# Patient Record
Sex: Male | Born: 1950 | Race: White | Hispanic: No | Marital: Married
Health system: Southern US, Community
[De-identification: ages and names within clinical notes are randomized; demographics above are authoritative.]

## PROBLEM LIST (undated history)

## (undated) DIAGNOSIS — I10 Essential (primary) hypertension: Secondary | ICD-10-CM

---

## 2020-02-06 ENCOUNTER — Emergency Department (HOSPITAL_COMMUNITY): Payer: BC Managed Care – PPO

## 2020-02-06 ENCOUNTER — Encounter (HOSPITAL_COMMUNITY): Payer: Self-pay

## 2020-02-06 ENCOUNTER — Other Ambulatory Visit: Payer: Self-pay

## 2020-02-06 ENCOUNTER — Emergency Department (HOSPITAL_COMMUNITY)
Admission: EM | Admit: 2020-02-06 | Discharge: 2020-02-06 | Disposition: A | Discharge status: Home / Self Care | Payer: BC Managed Care – PPO | Attending: Emergency Medicine | Admitting: Emergency Medicine

## 2020-02-06 DIAGNOSIS — I10 Essential (primary) hypertension: Secondary | ICD-10-CM | POA: Insufficient documentation

## 2020-02-06 DIAGNOSIS — S0001XA Abrasion of scalp, initial encounter: Secondary | ICD-10-CM | POA: Diagnosis not present

## 2020-02-06 DIAGNOSIS — Y929 Unspecified place or not applicable: Secondary | ICD-10-CM | POA: Insufficient documentation

## 2020-02-06 DIAGNOSIS — S50311A Abrasion of right elbow, initial encounter: Secondary | ICD-10-CM | POA: Insufficient documentation

## 2020-02-06 DIAGNOSIS — Y999 Unspecified external cause status: Secondary | ICD-10-CM | POA: Diagnosis not present

## 2020-02-06 DIAGNOSIS — Z23 Encounter for immunization: Secondary | ICD-10-CM | POA: Insufficient documentation

## 2020-02-06 DIAGNOSIS — Y939 Activity, unspecified: Secondary | ICD-10-CM | POA: Diagnosis not present

## 2020-02-06 DIAGNOSIS — S0003XA Contusion of scalp, initial encounter: Secondary | ICD-10-CM | POA: Diagnosis present

## 2020-02-06 DIAGNOSIS — W1789XA Other fall from one level to another, initial encounter: Secondary | ICD-10-CM | POA: Insufficient documentation

## 2020-02-06 DIAGNOSIS — W19XXXA Unspecified fall, initial encounter: Secondary | ICD-10-CM

## 2020-02-06 HISTORY — DX: Essential (primary) hypertension: I10

## 2020-02-06 MED ORDER — TETANUS-DIPHTH-ACELL PERTUSSIS 5-2.5-18.5 LF-MCG/0.5 IM SUSP
0.5000 mL | Freq: Once | INTRAMUSCULAR | Status: AC
  Administered 2020-02-06: 0.5 mL via INTRAMUSCULAR
  Filled 2020-02-06: qty 0.5

## 2020-02-06 NOTE — Discharge Instructions (Addendum)
The CT scan of your head and neck are reassuring, you do have a large hematoma (bruise) to the back of your scalp with an abrasion.  Your tetanus vaccine was updated today.  Recommend recheck with your primary care provider later this week, return to ER for worsening or concerning symptoms.

## 2020-02-06 NOTE — ED Provider Notes (Signed)
[facility] EMERGENCY DEPARTMENT Provider Note   CSN: [id number] Arrival date & time: 02/06/20  1951     History Chief Complaint  Patient presents with  . Fall    Alejandro Baxter is a 69 y.o. male.  69 year old male with past medical history of hypertension brought in by EMS for evaluation after fall tonight.  Patient states that he was backing out of the bed of a moving type truck when his foot slipped off of the bumper and he fell backwards first landing on his right elbow and then striking the back of his head on the ground.  Patient states he "saw stars" and was able to get up right after the fall on his own and sat back in the truck, at that point he felt the back of his head and noticed that his head was bleeding.  Patient went inside his house, wife was concerned for fall and striking head and called 911.  Patient denies any pain in his neck or back, denies headache, visual disturbance, nausea, vomiting, confusion or repeat questioning.  Patient is not on any blood thinners.  No other injuries, complaints or concerns.        Past Medical History:  Diagnosis Date  . Hypertension     There are no problems to display for this patient.   No family history on file.  Social History   Tobacco Use  . Smoking status: Not on file  Substance Use Topics  . Alcohol use: Not on file  . Drug use: Not on file    Home Medications Prior to Admission medications   Not on File    Allergies    Patient has no known allergies.  Review of Systems   Review of Systems  Constitutional: Negative for fever.  Eyes: Negative for visual disturbance.  Gastrointestinal: Negative for nausea and vomiting.  Musculoskeletal: Negative for back pain, gait problem, neck pain and neck stiffness.  Skin: Positive for wound.  Allergic/Immunologic: Negative for immunocompromised state.  Neurological: Negative for dizziness and headaches.  Hematological: Does not bruise/bleed easily.    Psychiatric/Behavioral: Negative for confusion.  All other systems reviewed and are negative.   Physical Exam Updated Vital Signs BP (!) 154/77 (BP Location: Right Arm)   Pulse 85   Temp 97.8 F (36.6 C) (Oral)   Resp 16   Ht 5\' 10"  (1.778 m)   Wt 81.6 kg   SpO2 99%   BMI 25.83 kg/m   Physical Exam Vitals and nursing note reviewed.  Constitutional:      General: He is not in acute distress.    Appearance: He is well-developed. He is not diaphoretic.  HENT:     Head: Normocephalic.   Eyes:     Extraocular Movements: Extraocular movements intact.     Pupils: Pupils are equal, round, and reactive to light.  Cardiovascular:     Rate and Rhythm: Normal rate and regular rhythm.     Pulses: Normal pulses.     Heart sounds: Normal heart sounds.  Pulmonary:     Effort: Pulmonary effort is normal.     Breath sounds: Normal breath sounds.  Musculoskeletal:        General: Signs of injury present. No swelling, tenderness or deformity. Normal range of motion.       Arms:     Cervical back: Normal range of motion and neck supple. No tenderness or bony tenderness.     Thoracic back: No tenderness or bony  tenderness.     Lumbar back: No tenderness or bony tenderness.     Right lower leg: No edema.     Left lower leg: No edema.  Skin:    General: Skin is warm and dry.     Findings: No erythema or rash.  Neurological:     Mental Status: He is alert and oriented to person, place, and time.     Cranial Nerves: No cranial nerve deficit.  Psychiatric:        Behavior: Behavior normal.     ED Results / Procedures / Treatments   Labs (all labs ordered are listed, but only abnormal results are displayed) Labs Reviewed - No data to display  EKG None  Radiology CT Head Wo Contrast  Result Date: 02/06/2020 CLINICAL DATA:  Recent fall with headaches and neck pain, initial encounter EXAM: CT HEAD WITHOUT CONTRAST CT CERVICAL SPINE WITHOUT CONTRAST TECHNIQUE: Multidetector CT  imaging of the head and cervical spine was performed following the standard protocol without intravenous contrast. Multiplanar CT image reconstructions of the cervical spine were also generated. COMPARISON:  None. FINDINGS: CT HEAD FINDINGS Brain: No evidence of acute infarction, hemorrhage, hydrocephalus, extra-axial collection or mass lesion/mass effect. Vascular: No hyperdense vessel or unexpected calcification. Skull: Normal. Negative for fracture or focal lesion. Sinuses/Orbits: No acute finding. Other: Mild scalp hematoma is noted in the right posterior parietal region near the vertex. CT CERVICAL SPINE FINDINGS Alignment: Within normal limits. Skull base and vertebrae: 7 cervical segments are well visualized. Vertebral body height is well maintained. No findings to suggest acute fracture or acute facet abnormality are seen. Mild facet hypertrophic changes are noted. The odontoid is within normal limits. Soft tissues and spinal canal: Surrounding soft tissue structures show no acute abnormality. Upper chest: Visualized lung apices are within normal limits. Other: None IMPRESSION: CT of the head: Mild scalp hematoma as described. No intracranial abnormality is noted. CT of the cervical spine: Mild degenerative change without acute abnormality. Electronically Signed   By: Alcide Clever M.D.   On: 02/06/2020 21:33   CT Cervical Spine Wo Contrast  Result Date: 02/06/2020 CLINICAL DATA:  Recent fall with headaches and neck pain, initial encounter EXAM: CT HEAD WITHOUT CONTRAST CT CERVICAL SPINE WITHOUT CONTRAST TECHNIQUE: Multidetector CT imaging of the head and cervical spine was performed following the standard protocol without intravenous contrast. Multiplanar CT image reconstructions of the cervical spine were also generated. COMPARISON:  None. FINDINGS: CT HEAD FINDINGS Brain: No evidence of acute infarction, hemorrhage, hydrocephalus, extra-axial collection or mass lesion/mass effect. Vascular: No  hyperdense vessel or unexpected calcification. Skull: Normal. Negative for fracture or focal lesion. Sinuses/Orbits: No acute finding. Other: Mild scalp hematoma is noted in the right posterior parietal region near the vertex. CT CERVICAL SPINE FINDINGS Alignment: Within normal limits. Skull base and vertebrae: 7 cervical segments are well visualized. Vertebral body height is well maintained. No findings to suggest acute fracture or acute facet abnormality are seen. Mild facet hypertrophic changes are noted. The odontoid is within normal limits. Soft tissues and spinal canal: Surrounding soft tissue structures show no acute abnormality. Upper chest: Visualized lung apices are within normal limits. Other: None IMPRESSION: CT of the head: Mild scalp hematoma as described. No intracranial abnormality is noted. CT of the cervical spine: Mild degenerative change without acute abnormality. Electronically Signed   By: Alcide Clever M.D.   On: 02/06/2020 21:33    Procedures Procedures (including critical care time)  Medications Ordered in ED Medications  Tdap (BOOSTRIX) injection 0.5 mL (0.5 mLs Intramuscular Given 02/06/20 2036)    ED Course  I have reviewed the triage vital signs and the nursing notes.  Pertinent labs & imaging results that were available during my care of the patient were reviewed by me and considered in my medical decision making (see chart for details).  Clinical Course as of Feb 05 2154  Wed Feb 06, 4379  8049 69 year old male brought in by EMS after falling backwards off of a truck and hitting his head on the ground.  No loss of consciousness, denies headache, neck pain, back pain, blurry vision, nausea or vomiting.  Patient is ambulatory with a steady gait.  Patient was found to have a hematoma to the posterior scalp with abrasion, no wounds requiring suturing today.  Also found to have an abrasion to his right elbow.  Discussed with Dr. Darl Householder, ER attending, CT head and neck without  acute injury.  Patient was discharged, tetanus was updated today.   [LM]    Clinical Course User Index [LM] Roque Lias   MDM Rules/Calculators/A&P                      Final Clinical Impression(s) / ED Diagnoses Final diagnoses:  Fall, initial encounter  Hematoma of scalp, initial encounter    Rx / DC Orders ED Discharge Orders    None       Roque Lias 02/06/20 2156    Drenda Freeze, MD 02/09/20 1149

## 2020-02-06 NOTE — ED Notes (Signed)
Pt ambulated without assistance, No complaints of pain, dizziness, ShOB, nausea or visual change

## 2020-02-06 NOTE — ED Triage Notes (Signed)
[place] with a chief complaint of a fall that happened approximately 1845. The patient states he was on a moving truck, lost his footing, and fell 2 feet to the ground backwards. Possible LOC 5-10 seconds reported by the patient. IV initiated by EMS in the right [facility]. Pt reports no neck or back pain and a generalized headache. Hematoma is noted on the back of his head.

## 2020-09-29 IMAGING — CT CT HEAD W/O CM
4 series · 16 of 47 positions shown, 18 images · non-contrast
Comparison: None.

CLINICAL DATA: Recent fall with headaches and neck pain, initial
encounter

EXAM:
CT HEAD WITHOUT CONTRAST
CT CERVICAL SPINE WITHOUT CONTRAST
TECHNIQUE: Multidetector CT imaging of the head and cervical spine was
performed following the standard protocol without intravenous
contrast. Multiplanar CT image reconstructions of the cervical spine
were also generated.

[Series 3: head without · axial · non-contrast · 0.46mm/px · z∈[-121,+9]mm · 7 of 36 slices shown, 9 images]
[im 5/36  brain]
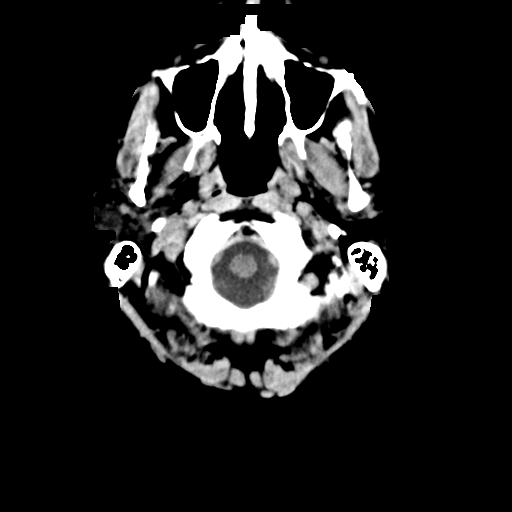
[im 5/36  bone]
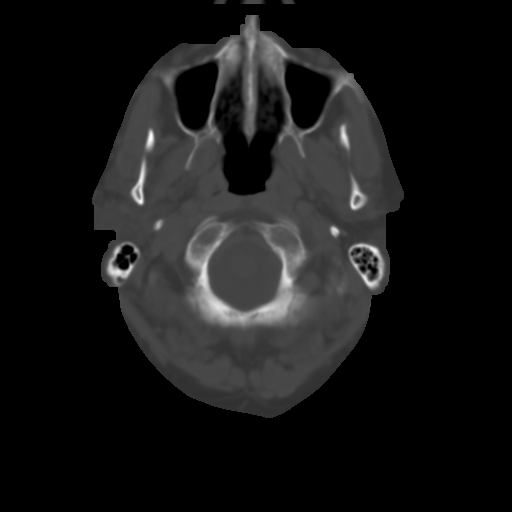
[im 9/36  brain]
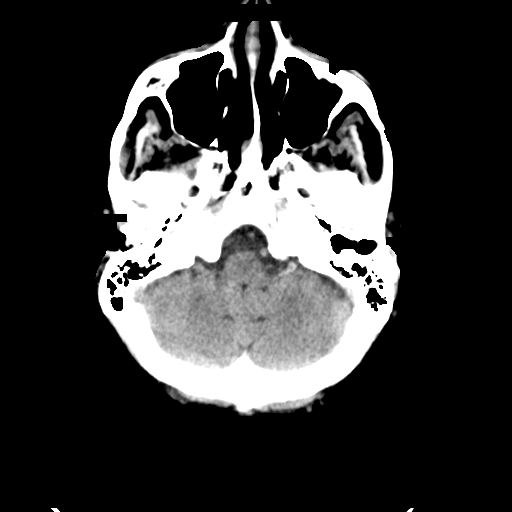
[im 14/36  brain]
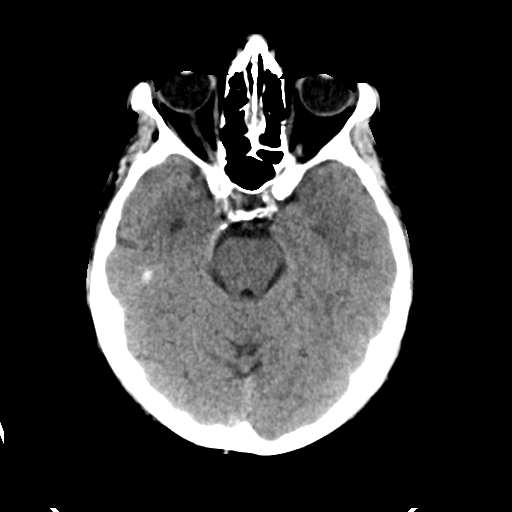
[im 18/36  brain]
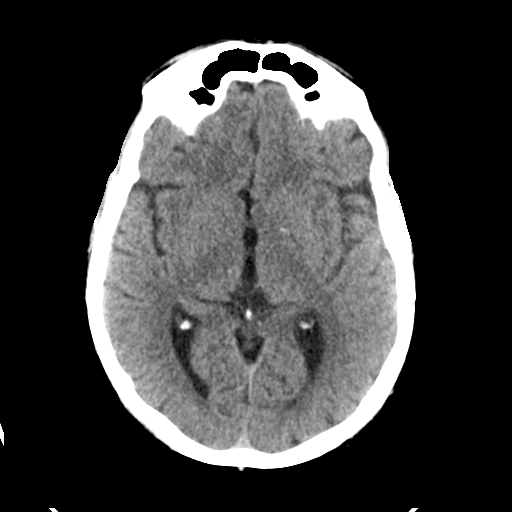
[im 22/36  brain]
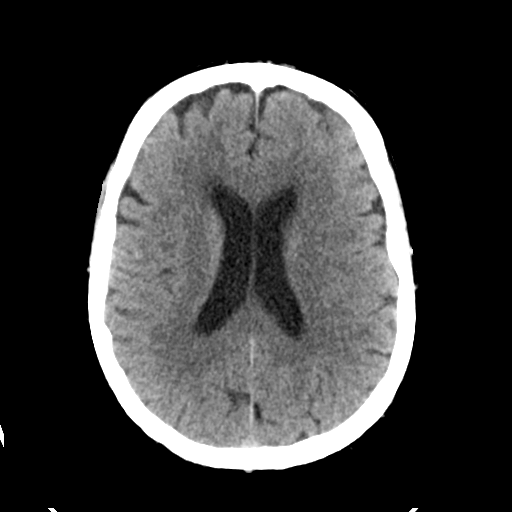
[im 22/36  bone]
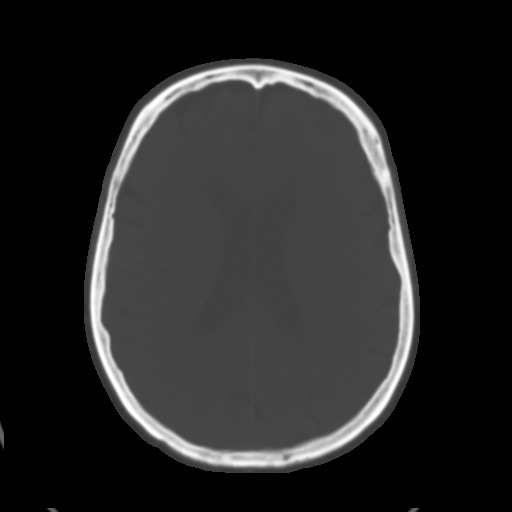
[im 27/36  brain]
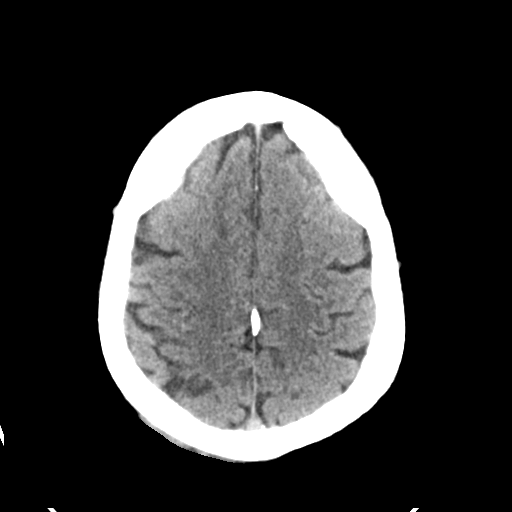
[im 31/36  brain]
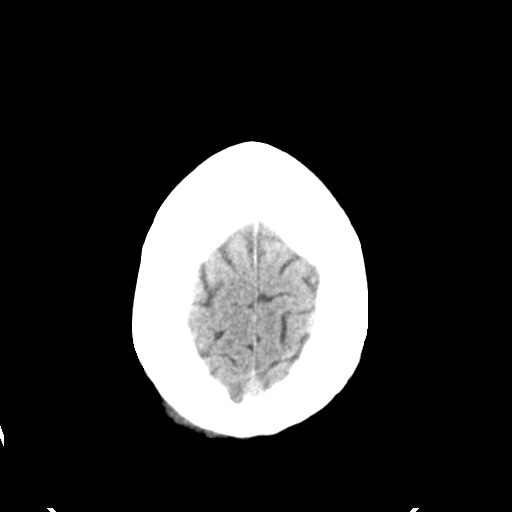

[Series 4: head bone · axial · 0.46mm/px · z∈[-125,-89]mm · 3 of 90 slices shown]
[im 9/90  bone]
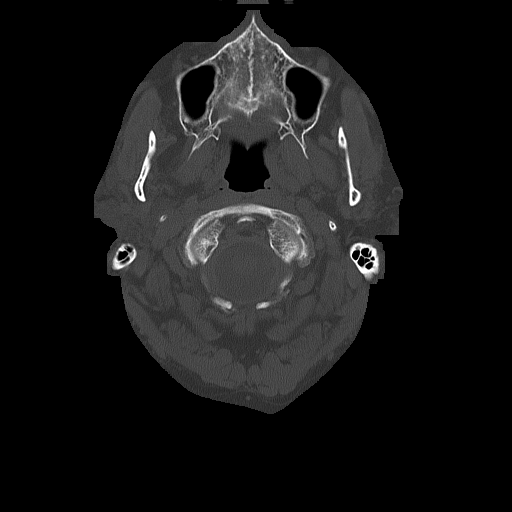
[im 18/90  bone]
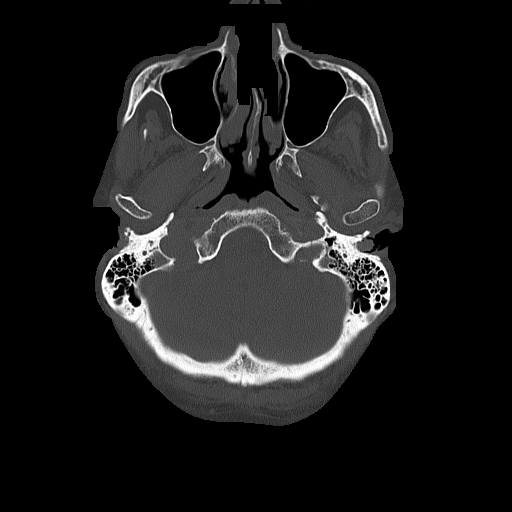
[im 27/90  bone]
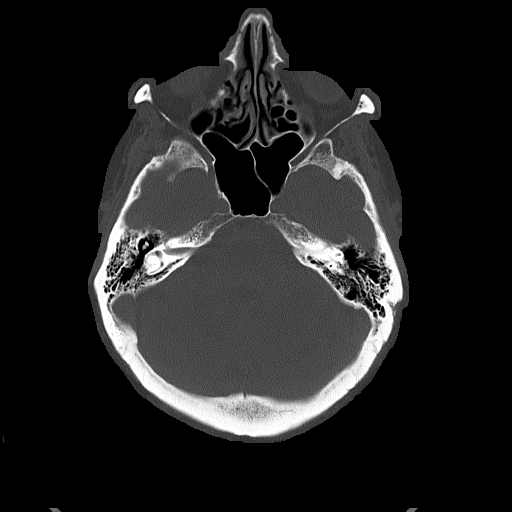

[Series 5: head without cor · coronal · non-contrast · 0.36mm/px · 3 of 69 slices shown]
[im 23/69  brain]
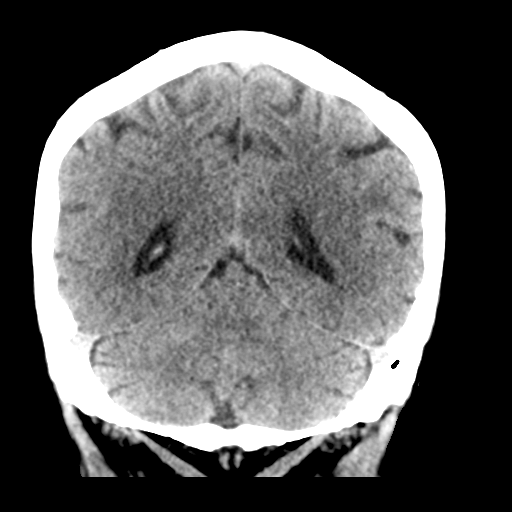
[im 31/69  brain]
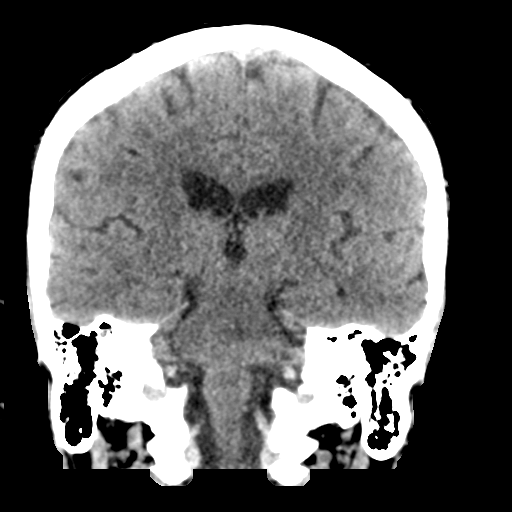
[im 38/69  brain]
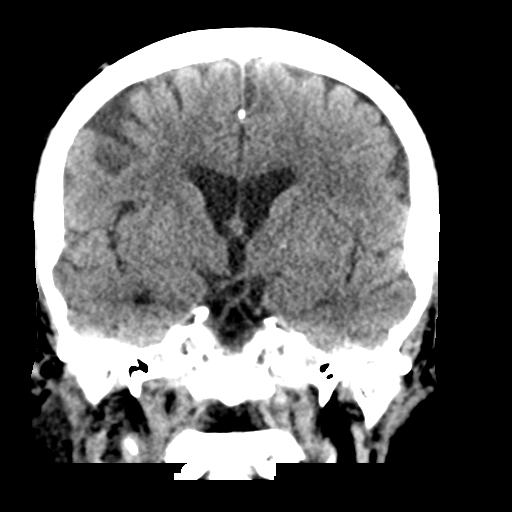

[Series 6: head without sag · sagittal · non-contrast · 0.33mm/px · 3 of 67 slices shown]
[im 23/67  brain]
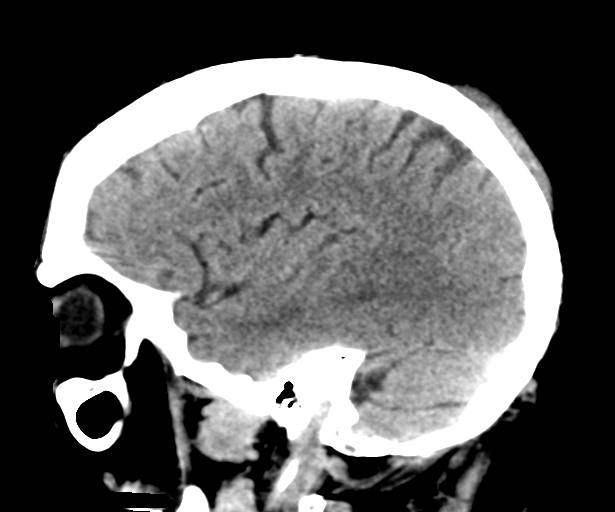
[im 34/67  brain]
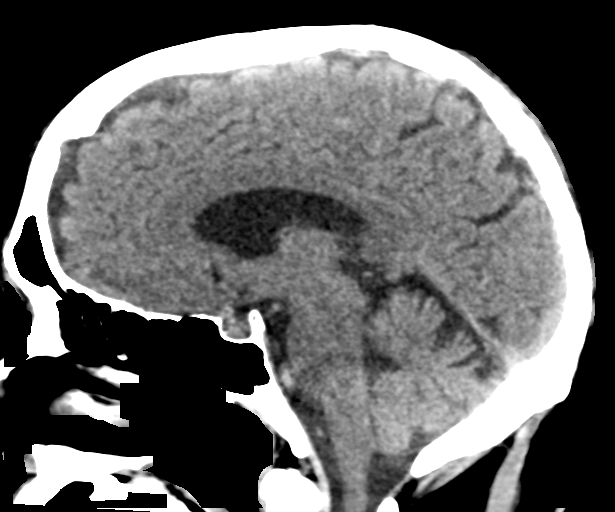
[im 45/67  brain]
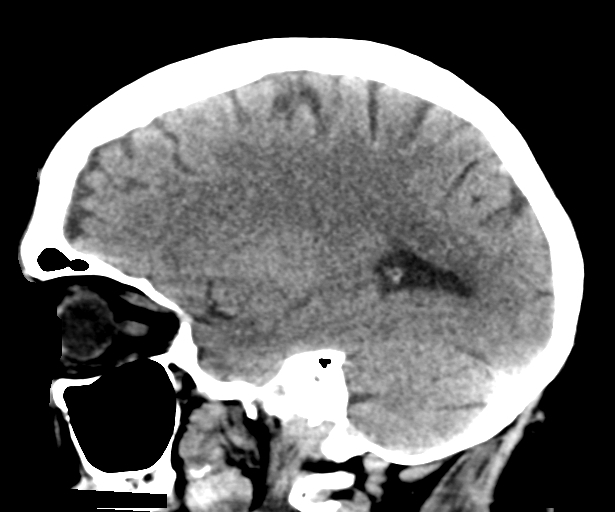

[16 of 47 positions shown; findings below may reference images not displayed]

FINDINGS: CT HEAD FINDINGS

Brain: No evidence of acute infarction, hemorrhage, hydrocephalus,
extra-axial collection or mass lesion/mass effect.

Vascular: No hyperdense vessel or unexpected calcification.

Skull: Normal. Negative for fracture or focal lesion.

Sinuses/Orbits: No acute finding.

Other: Mild scalp hematoma is noted in the right posterior parietal
region near the vertex.

CT CERVICAL SPINE FINDINGS

Alignment: Within normal limits.

Skull base and vertebrae: 7 cervical segments are well visualized.
Vertebral body height is well maintained. No findings to suggest
acute fracture or acute facet abnormality are seen. Mild facet
hypertrophic changes are noted. The odontoid is within normal
limits.

Soft tissues and spinal canal: Surrounding soft tissue structures
show no acute abnormality.

Upper chest: Visualized lung apices are within normal limits.

Other: None
IMPRESSION: CT of the head: Mild scalp hematoma as described. No intracranial
abnormality is noted.

CT of the cervical spine: Mild degenerative change without acute
abnormality.
